# Patient Record
Sex: Female | Born: 1955 | Race: White | Hispanic: No | Marital: Married | Smoking: Former smoker
Health system: Southern US, Community
[De-identification: ages and names within clinical notes are randomized; demographics above are authoritative.]

## PROBLEM LIST (undated history)

## (undated) DIAGNOSIS — J302 Other seasonal allergic rhinitis: Secondary | ICD-10-CM

## (undated) DIAGNOSIS — F419 Anxiety disorder, unspecified: Secondary | ICD-10-CM

## (undated) DIAGNOSIS — F32A Depression, unspecified: Secondary | ICD-10-CM

## (undated) DIAGNOSIS — M1990 Unspecified osteoarthritis, unspecified site: Secondary | ICD-10-CM

## (undated) DIAGNOSIS — H40009 Preglaucoma, unspecified, unspecified eye: Secondary | ICD-10-CM

## (undated) DIAGNOSIS — F329 Major depressive disorder, single episode, unspecified: Secondary | ICD-10-CM

## (undated) HISTORY — DX: Major depressive disorder, single episode, unspecified: F32.9

## (undated) HISTORY — DX: Preglaucoma, unspecified, unspecified eye: H40.009

## (undated) HISTORY — DX: Anxiety disorder, unspecified: F41.9

## (undated) HISTORY — DX: Unspecified osteoarthritis, unspecified site: M19.90

## (undated) HISTORY — DX: Other seasonal allergic rhinitis: J30.2

## (undated) HISTORY — DX: Depression, unspecified: F32.A

---

## 1998-05-31 ENCOUNTER — Other Ambulatory Visit: Admission: RE | Admit: 1998-05-31 | Discharge: 1998-05-31 | Payer: Self-pay | Admitting: Obstetrics & Gynecology

## 1998-08-04 ENCOUNTER — Encounter: Admission: RE | Admit: 1998-08-04 | Discharge: 1998-08-04 | Payer: Self-pay | Admitting: *Deleted

## 1999-06-22 ENCOUNTER — Other Ambulatory Visit: Admission: RE | Admit: 1999-06-22 | Discharge: 1999-06-22 | Payer: Self-pay | Admitting: Obstetrics & Gynecology

## 2000-07-11 ENCOUNTER — Other Ambulatory Visit: Admission: RE | Admit: 2000-07-11 | Discharge: 2000-07-11 | Payer: Self-pay | Admitting: Obstetrics & Gynecology

## 2001-08-21 ENCOUNTER — Other Ambulatory Visit: Admission: RE | Admit: 2001-08-21 | Discharge: 2001-08-21 | Payer: Self-pay | Admitting: Obstetrics & Gynecology

## 2002-09-24 ENCOUNTER — Other Ambulatory Visit: Admission: RE | Admit: 2002-09-24 | Discharge: 2002-09-24 | Payer: Self-pay | Admitting: Obstetrics & Gynecology

## 2004-04-01 ENCOUNTER — Other Ambulatory Visit: Admission: RE | Admit: 2004-04-01 | Discharge: 2004-04-01 | Payer: Self-pay | Admitting: Family Medicine

## 2005-04-04 ENCOUNTER — Other Ambulatory Visit: Admission: RE | Admit: 2005-04-04 | Discharge: 2005-04-04 | Payer: Self-pay | Admitting: Obstetrics & Gynecology

## 2008-02-20 ENCOUNTER — Emergency Department (HOSPITAL_COMMUNITY): Admission: EM | Admit: 2008-02-20 | Discharge: 2008-02-20 | Payer: Self-pay | Admitting: Family Medicine

## 2010-07-27 ENCOUNTER — Emergency Department (HOSPITAL_COMMUNITY): Admission: EM | Admit: 2010-07-27 | Discharge: 2010-07-27 | Payer: Self-pay | Admitting: Emergency Medicine

## 2011-03-03 LAB — URINE MICROSCOPIC-ADD ON

## 2011-03-03 LAB — URINALYSIS, ROUTINE W REFLEX MICROSCOPIC
Bilirubin Urine: NEGATIVE
Ketones, ur: NEGATIVE mg/dL
Nitrite: NEGATIVE
Urobilinogen, UA: 0.2 mg/dL (ref 0.0–1.0)

## 2011-03-03 LAB — URINE CULTURE: Colony Count: >=100000

## 2015-02-17 ENCOUNTER — Encounter: Payer: Self-pay | Admitting: Internal Medicine

## 2015-04-12 ENCOUNTER — Ambulatory Visit (AMBULATORY_SURGERY_CENTER): Payer: Self-pay | Admitting: *Deleted

## 2015-04-12 VITALS — Ht 67.0 in | Wt 194.0 lb

## 2015-04-12 DIAGNOSIS — Z1211 Encounter for screening for malignant neoplasm of colon: Secondary | ICD-10-CM

## 2015-04-12 MED ORDER — NA SULFATE-K SULFATE-MG SULF 17.5-3.13-1.6 GM/177ML PO SOLN: ORAL | Status: DC

## 2015-04-12 NOTE — Progress Notes (Signed)
No allergies to eggs or soy. No problems with anesthesia.  Pt given Emmi instructions for colonoscopy  No oxygen use  No diet drug use  

## 2015-04-19 ENCOUNTER — Encounter: Payer: Self-pay | Admitting: Internal Medicine

## 2015-04-19 ENCOUNTER — Ambulatory Visit (AMBULATORY_SURGERY_CENTER): Payer: 59 | Admitting: Internal Medicine

## 2015-04-19 VITALS — BP 128/87 | HR 61 | Temp 97.9°F | Resp 13 | Ht 67.0 in | Wt 194.0 lb

## 2015-04-19 DIAGNOSIS — D125 Benign neoplasm of sigmoid colon: Secondary | ICD-10-CM | POA: Diagnosis not present

## 2015-04-19 DIAGNOSIS — D123 Benign neoplasm of transverse colon: Secondary | ICD-10-CM

## 2015-04-19 DIAGNOSIS — Z1211 Encounter for screening for malignant neoplasm of colon: Secondary | ICD-10-CM | POA: Diagnosis not present

## 2015-04-19 DIAGNOSIS — D122 Benign neoplasm of ascending colon: Secondary | ICD-10-CM

## 2015-04-19 MED ORDER — SODIUM CHLORIDE 0.9 % IV SOLN: 500.0000 mL | INTRAVENOUS | Status: DC

## 2015-04-19 NOTE — Patient Instructions (Signed)
Discharge instructions given. Handouts on polyps and diverticulosis. Resume previous medications. YOU HAD AN ENDOSCOPIC PROCEDURE TODAY AT THE [facility]:   Refer to the procedure report that was given to you for any specific questions about what was found during the examination.  If the procedure report does not answer your questions, please call your gastroenterologist to clarify.  If you requested that your care partner not be given the details of your procedure findings, then the procedure report has been included in a sealed envelope for you to review at your convenience later.  YOU SHOULD EXPECT: Some feelings of bloating in the abdomen. Passage of more gas than usual.  Walking can help get rid of the air that was put into your GI tract during the procedure and reduce the bloating. If you had a lower endoscopy (such as a colonoscopy or flexible sigmoidoscopy) you may notice spotting of blood in your stool or on the toilet paper. If you underwent a bowel prep for your procedure, you may not have a normal bowel movement for a few days.  Please Note:  You might notice some irritation and congestion in your nose or some drainage.  This is from the oxygen used during your procedure.  There is no need for concern and it should clear up in a day or so.  SYMPTOMS TO REPORT IMMEDIATELY:   Following lower endoscopy (colonoscopy or flexible sigmoidoscopy):  Excessive amounts of blood in the stool  Significant tenderness or worsening of abdominal pains  Swelling of the abdomen that is new, acute  Fever of 100F or higher   For urgent or emergent issues, a gastroenterologist can be reached at any hour by calling [phone].   DIET: Your first meal following the procedure should be a small meal and then it is ok to progress to your normal diet. Heavy or fried foods are harder to digest and may make you feel nauseous or bloated.  Likewise, meals heavy in dairy and vegetables can  increase bloating.  Drink plenty of fluids but you should avoid alcoholic beverages for 24 hours.  ACTIVITY:  You should plan to take it easy for the rest of today and you should NOT DRIVE or use heavy machinery until tomorrow (because of the sedation medicines used during the test).    FOLLOW UP: Our staff will call the number listed on your records the next business day following your procedure to check on you and address any questions or concerns that you may have regarding the information given to you following your procedure. If we do not reach you, we will leave a message.  However, if you are feeling well and you are not experiencing any problems, there is no need to return our call.  We will assume that you have returned to your regular daily activities without incident.  If any biopsies were taken you will be contacted by phone or by letter within the next 1-3 weeks.  Please call us at [phone] if you have not heard about the biopsies in 3 weeks.    SIGNATURES/CONFIDENTIALITY: You and/or your care partner have signed paperwork which will be entered into your electronic medical record.  These signatures attest to the fact that that the information above on your After Visit Summary has been reviewed and is understood.  Full responsibility of the confidentiality of this discharge information lies with you and/or your care-partner. 

## 2015-04-19 NOTE — Progress Notes (Signed)
Made CRNA aware that pt. Stated she took a sip of water at 9:40. Prior to procedure.

## 2015-04-19 NOTE — Op Note (Signed)
[facility]  [address] [place], [id number]   COLONOSCOPY PROCEDURE REPORT  PATIENT: Diana Sanford, Diana Sanford  MR#: [id number] BIRTHDATE: Jul 02, 1956 , 59  yrs. old GENDER: female ENDOSCOPIST: Jerene Bears, MD REFERRED KG:URKY Redmond Pulling, MD PROCEDURE DATE:  04/19/2015 PROCEDURE:   Colonoscopy with snare polypectomy and Colonoscopy, screening First Screening Colonoscopy - Avg.  risk and is 50 yrs.  old or older Yes.  Prior Negative Screening - Now for repeat screening. N/A  History of Adenoma - Now for follow-up colonoscopy & has been > or = to 3 yrs.  N/A ASA CLASS:   Class II INDICATIONS:Screening for colonic neoplasia and Colorectal Neoplasm Risk Assessment for this procedure is average risk. MEDICATIONS: Monitored anesthesia care and Propofol 200 mg IV  DESCRIPTION OF PROCEDURE:   After the risks benefits and alternatives of the procedure were thoroughly explained, informed consent was obtained.  The digital rectal exam revealed several skin tags and revealed hemorrhoids.   The LB [id number] endoscope was introduced through the anus and advanced to the cecum, which was identified by both the appendix and ileocecal valve. No adverse events experienced.   The quality of the prep was (MoviPrep was used) good.  The instrument was then slowly withdrawn as the colon was fully examined.   COLON FINDINGS: Four sessile polyps ranging between 3-46mm in size were found in the ascending colon (1), transverse colon (1), and sigmoid colon (2).  Polypectomies were performed with a cold snare. The resection was complete, the polyp tissue was completely retrieved and sent to histology.   There was severe diverticulosis noted in the descending colon and sigmoid colon with associated muscular hypertrophy.  Retroflexed views revealed internal and external hemorrhoids and skin tags. The time to cecum = 4.8 Withdrawal time = 13.6   The scope was withdrawn and the  procedure completed. COMPLICATIONS: There were no immediate complications.  ENDOSCOPIC IMPRESSION: 1.   Four sessile polyps ranging between 3-29mm in size were found in the ascending colon, transverse colon, and sigmoid colon; polypectomies were performed with a cold snare 2.   There was severe diverticulosis noted in the descending colon and sigmoid colon  RECOMMENDATIONS: 1.  Await pathology results 2.  High fiber diet 3.  Timing of repeat colonoscopy will be determined by pathology findings. 4.  You will receive a letter within 1-2 weeks with the results of your biopsy as well as final recommendations.  Please call my office if you have not received a letter after 3 weeks.  eSigned:  Jerene Bears, MD 04/19/2015 10:52 AM  cc: The Patient and Kathryne Eriksson, MD

## 2015-04-19 NOTE — Progress Notes (Signed)
Called to room to assist during endoscopic procedure.  Patient ID and intended procedure confirmed with present staff. Received instructions for my participation in the procedure from the performing physician.  

## 2015-04-19 NOTE — Progress Notes (Signed)
Report to PACU, RN, vss, BBS= Clear.  

## 2015-04-20 ENCOUNTER — Telehealth: Payer: Self-pay

## 2015-04-20 NOTE — Telephone Encounter (Signed)
Left message on answering machine. 

## 2015-04-23 ENCOUNTER — Encounter: Payer: Self-pay | Admitting: Internal Medicine

## 2017-09-20 ENCOUNTER — Other Ambulatory Visit: Payer: Self-pay | Admitting: Obstetrics & Gynecology

## 2017-09-20 DIAGNOSIS — N644 Mastodynia: Secondary | ICD-10-CM

## 2018-03-20 ENCOUNTER — Encounter: Payer: Self-pay | Admitting: Internal Medicine

## 2021-05-25 ENCOUNTER — Other Ambulatory Visit: Payer: Self-pay

## 2021-05-25 ENCOUNTER — Ambulatory Visit (INDEPENDENT_AMBULATORY_CARE_PROVIDER_SITE_OTHER): Payer: BLUE CROSS/BLUE SHIELD

## 2021-05-25 ENCOUNTER — Encounter (HOSPITAL_COMMUNITY): Payer: Self-pay

## 2021-05-25 ENCOUNTER — Ambulatory Visit (HOSPITAL_COMMUNITY)
Admission: EM | Admit: 2021-05-25 | Discharge: 2021-05-25 | Disposition: A | Discharge status: Home / Self Care | Payer: BLUE CROSS/BLUE SHIELD | Attending: Physician Assistant | Admitting: Physician Assistant

## 2021-05-25 DIAGNOSIS — S161XXA Strain of muscle, fascia and tendon at neck level, initial encounter: Secondary | ICD-10-CM | POA: Diagnosis not present

## 2021-05-25 DIAGNOSIS — M545 Low back pain, unspecified: Secondary | ICD-10-CM

## 2021-05-25 DIAGNOSIS — M542 Cervicalgia: Secondary | ICD-10-CM | POA: Diagnosis not present

## 2021-05-25 MED ORDER — BACLOFEN 10 MG PO TABS: 10.0000 mg | ORAL_TABLET | Freq: Two times a day (BID) | ORAL | 0 refills | Status: DC | PRN
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
## 2021-05-25 NOTE — Discharge Instructions (Signed)
X-ray of your neck and lower spine showed degenerative (arthritis) changes but did not show any acute fracture which is excellent news.  I have called in a low-dose muscle relaxer to help with your symptoms.  Please do not drive or drink alcohol while taking this.  I recommend you primarily take it at night.  Use heat and stretch for additional symptom relief.  It may be worthwhile to contact her primary care provider and consider referral to physical therapist.  If you have any sudden worsening of symptoms you need to be reevaluated immediately.
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
## 2024-10-16 ENCOUNTER — Other Ambulatory Visit: Payer: Self-pay

## 2024-10-16 ENCOUNTER — Ambulatory Visit (INDEPENDENT_AMBULATORY_CARE_PROVIDER_SITE_OTHER)

## 2024-10-16 ENCOUNTER — Ambulatory Visit (HOSPITAL_COMMUNITY)
Admission: EM | Admit: 2024-10-16 | Discharge: 2024-10-16 | Disposition: A | Discharge status: Home / Self Care | Attending: Emergency Medicine | Admitting: Emergency Medicine

## 2024-10-16 ENCOUNTER — Encounter (HOSPITAL_COMMUNITY): Payer: Self-pay | Admitting: *Deleted

## 2024-10-16 DIAGNOSIS — M25511 Pain in right shoulder: Secondary | ICD-10-CM

## 2024-10-16 MED ORDER — BACLOFEN 10 MG PO TABS: 10.0000 mg | ORAL_TABLET | Freq: Three times a day (TID) | ORAL | 0 refills | Status: AC

## 2024-10-16 NOTE — Discharge Instructions (Addendum)
 Your x-ray is negative for any underlying fracture or dislocation.  As discussed I believe your pain is likely muscular in nature. You can take baclofen  every 8 hours as needed for muscle pain and spasms. You can alternate between 650 mg of Tylenol and 400 to 600 mg of ibuprofen every 6-8 hours as needed for pain as well. Alternate between ice and heat and do some gentle stretching. Follow-up with [facility] if your pain continues for further evaluation.
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
